# Patient Record
Sex: Male | Born: 1995 | Hispanic: Refuse to answer | Marital: Single | Smoking: Never smoker
Health system: Southern US, Community
[De-identification: ages and names within clinical notes are randomized; demographics above are authoritative.]

---

## 2017-06-14 ENCOUNTER — Ambulatory Visit (INDEPENDENT_AMBULATORY_CARE_PROVIDER_SITE_OTHER): Payer: BLUE CROSS/BLUE SHIELD | Admitting: Family Medicine

## 2017-06-14 ENCOUNTER — Encounter: Payer: Self-pay | Admitting: Family Medicine

## 2017-06-14 VITALS — BP 121/66 | HR 58 | Temp 97.7°F | Resp 14

## 2017-06-14 DIAGNOSIS — J069 Acute upper respiratory infection, unspecified: Secondary | ICD-10-CM

## 2017-06-14 MED ORDER — AZITHROMYCIN 250 MG PO TABS: ORAL_TABLET | ORAL | 0 refills | Status: AC

## 2017-06-14 MED ORDER — ALBUTEROL SULFATE HFA 108 (90 BASE) MCG/ACT IN AERS: 2.0000 | INHALATION_SPRAY | Freq: Four times a day (QID) | RESPIRATORY_TRACT | 0 refills | Status: AC | PRN

## 2017-06-14 NOTE — Progress Notes (Signed)
Patient presents today with symptoms of mild productive cough for about 2 weeks. Patient has had a intermittent sore throat as well. He denies any fever or chills recently but did have it early on. He denies any significant shortness of breath. He does mention that he has wheezing at times but is very intermittent. He denies any swelling or pain in his lower extremities. He denies any severe headache, nausea, vomiting, diarrhea, abdominal pain. He has not taken any medications for his symptoms. He denies any history of chronic allergies or asthma.  ROS: Negative except mentioned above. Vitals as per Epic. GENERAL: NAD HEENT: mild pharyngeal erythema, no exudate, no erythema of TMs, no cervical LAD RESP: CTA B, no wheezing or accessory muscle use appreciated, can speak in complete sentences without difficulty CARD: RRR NEURO: CN II-XII grossly intact   A/P: Upper respiratory infection - will treat with Z-Pak, Albuterol Inhaler when necessary, Delsym when necessary, rest, hydration, seek medical attention if symptoms persist or worsen as discussed. No athletic activity or class if febrile.

## 2017-07-28 ENCOUNTER — Other Ambulatory Visit: Payer: Self-pay | Admitting: Orthopedic Surgery

## 2017-07-28 DIAGNOSIS — M25532 Pain in left wrist: Secondary | ICD-10-CM

## 2017-07-29 ENCOUNTER — Ambulatory Visit
Admission: RE | Admit: 2017-07-29 | Discharge: 2017-07-29 | Disposition: A | Discharge status: Home / Self Care | Payer: BLUE CROSS/BLUE SHIELD | Source: Ambulatory Visit | Attending: Orthopedic Surgery | Admitting: Orthopedic Surgery

## 2017-07-29 DIAGNOSIS — S62145A Nondisplaced fracture of body of hamate [unciform] bone, left wrist, initial encounter for closed fracture: Secondary | ICD-10-CM | POA: Diagnosis not present

## 2017-07-29 DIAGNOSIS — M25532 Pain in left wrist: Secondary | ICD-10-CM | POA: Insufficient documentation

## 2017-07-29 DIAGNOSIS — X58XXXA Exposure to other specified factors, initial encounter: Secondary | ICD-10-CM | POA: Diagnosis not present

## 2018-06-17 IMAGING — MR MR WRIST*L* W/O CM
4 of 6 series · 28 of 40 positions shown · non-contrast
Comparison: None.

CLINICAL DATA: Ulnar-sided wrist pain x2 weeks. Prior hamate
surgery in in January 2017.

EXAM:
MR OF THE LEFT WRIST WITHOUT CONTRAST
TECHNIQUE: Multiplanar, multisequence MR imaging of the left wrist was
performed. No intravenous contrast was administered.

[Series 4: T2 fat-sat · sagittal · 3.0mm · 0.43mm/px · 6 of 25 slices shown]
[im 1/25]
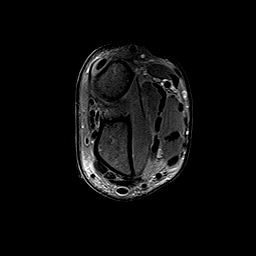
[im 5/25]
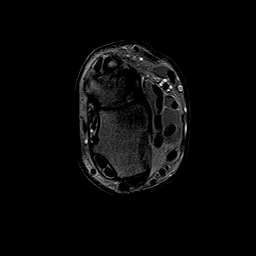
[im 10/25]
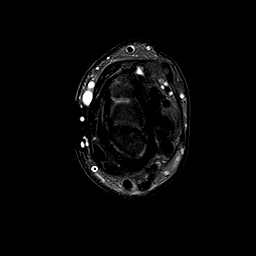
[im 15/25]
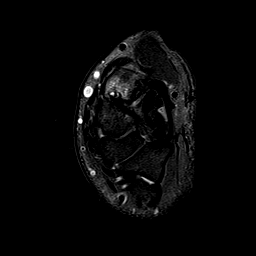
[im 20/25]
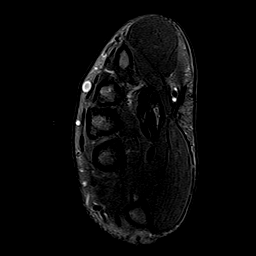
[im 25/25]
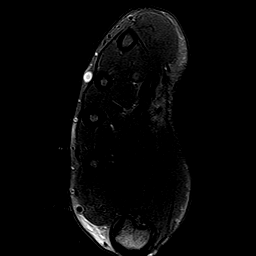

[Series 5: T1 · sagittal · 3.0mm · 0.43mm/px · 7 of 25 slices shown]
[im 1/25]
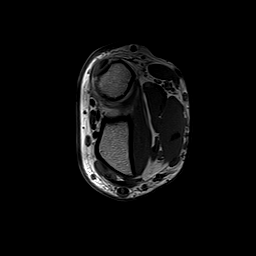
[im 5/25]
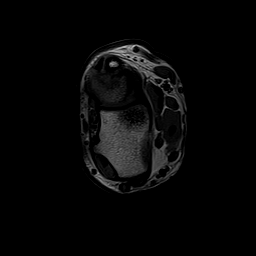
[im 9/25]
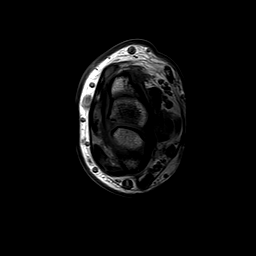
[im 13/25]
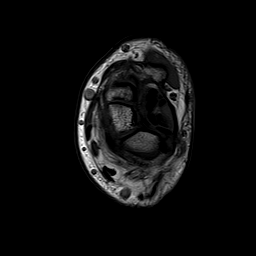
[im 17/25]
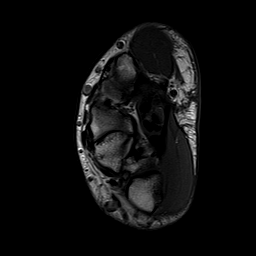
[im 21/25]
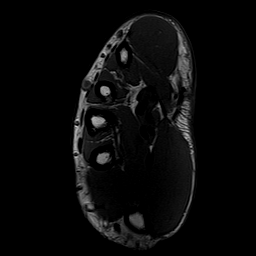
[im 25/25]
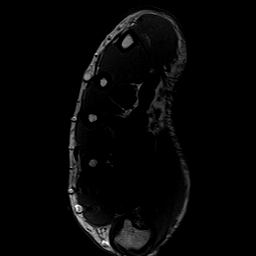

[Series 7: PD fat-sat · coronal · 3.0mm · 0.23mm/px · 6 of 21 slices shown (1 of 2)]
[im 1/21]
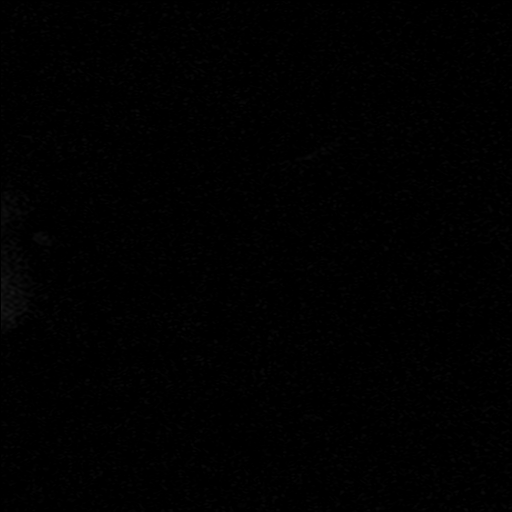
[im 5/21]
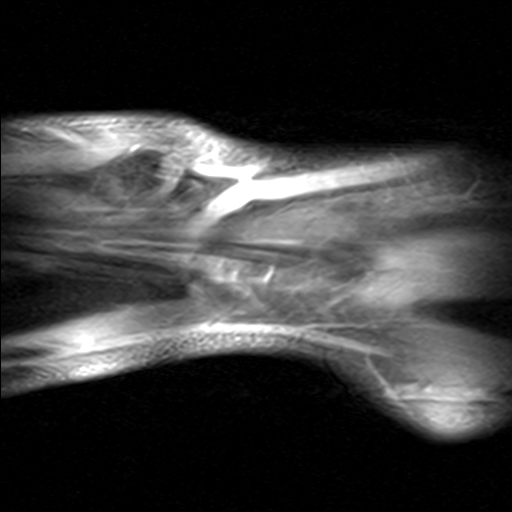
[im 9/21]
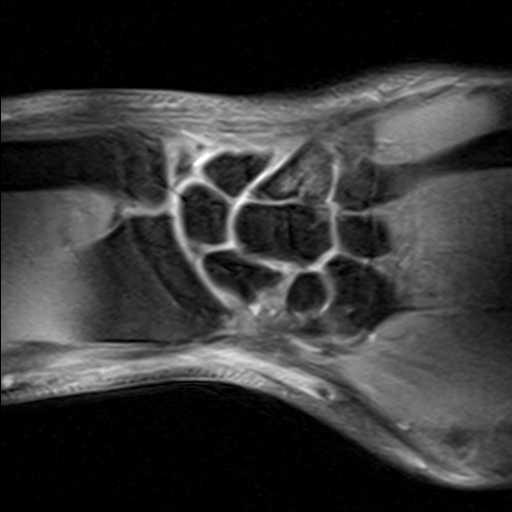
[im 13/21]
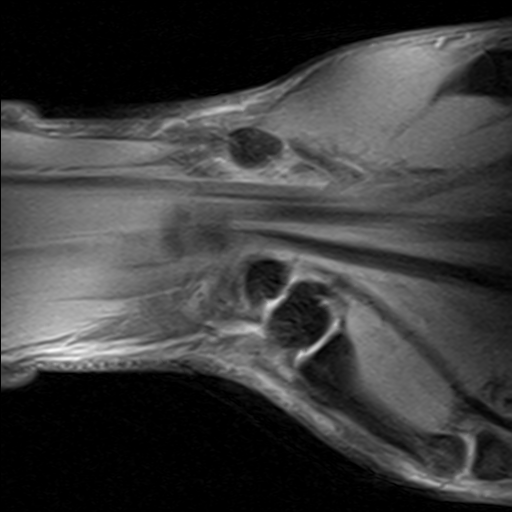
[im 17/21]
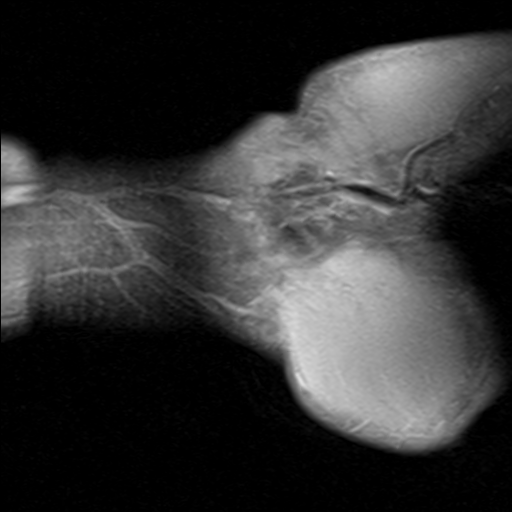
[im 21/21]
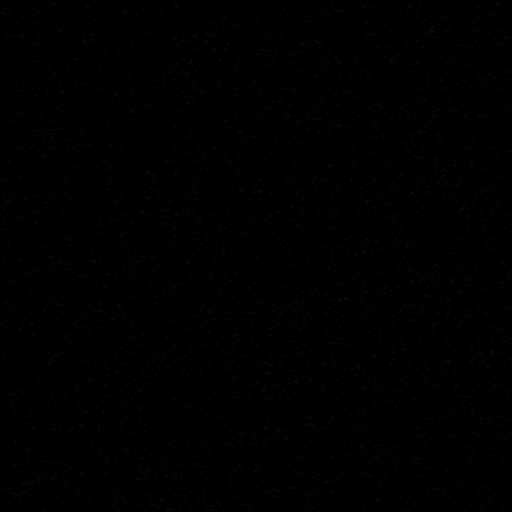

[Series 9: PD fat-sat · axial · 3.0mm · 0.21mm/px · z∈[-55,+34]mm · 9 of 33 slices shown (2 of 2)]
[im 1/33]
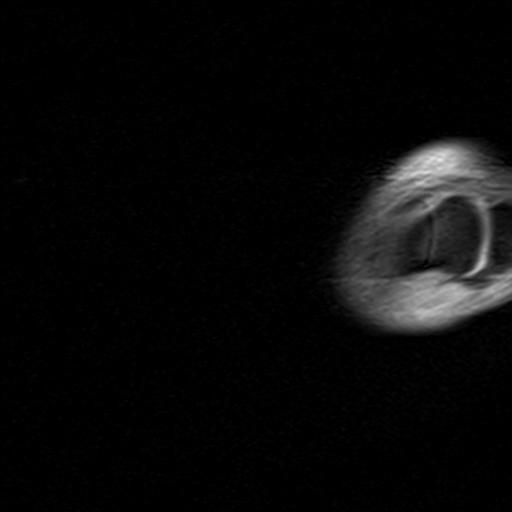
[im 5/33]
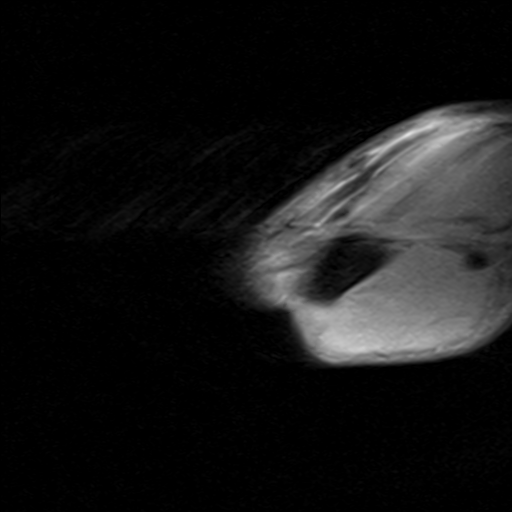
[im 9/33]
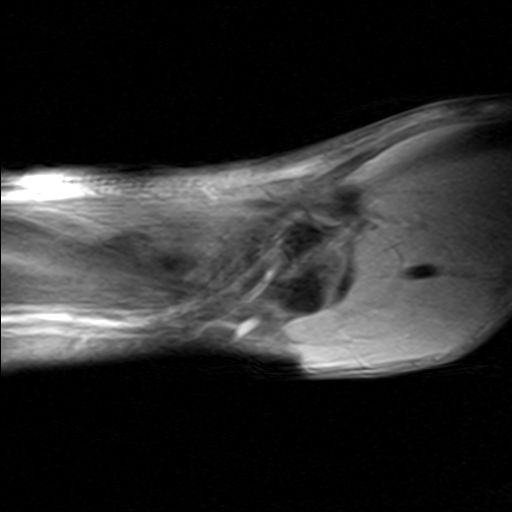
[im 13/33]
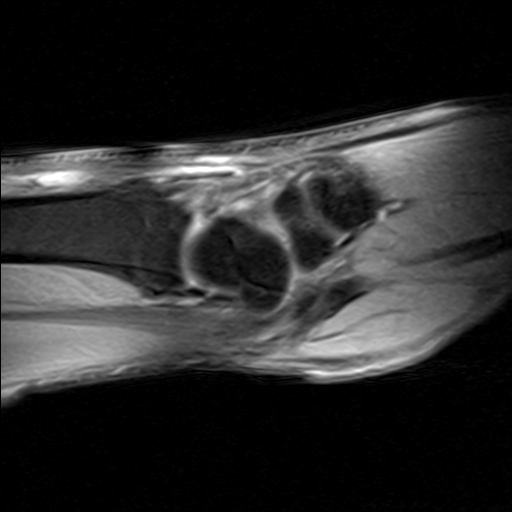
[im 17/33]
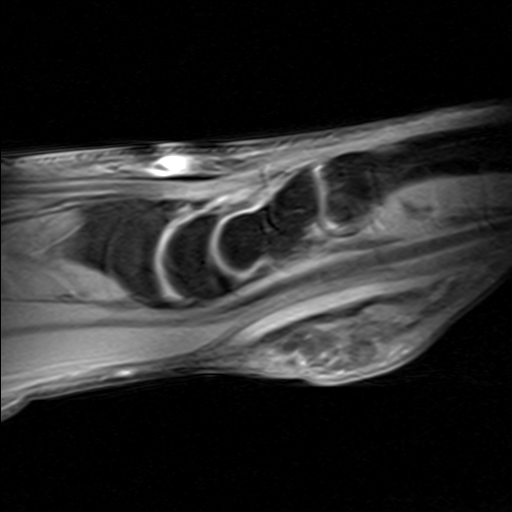
[im 21/33]
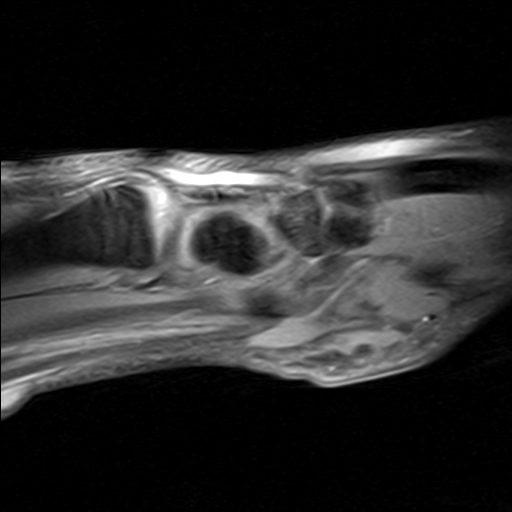
[im 25/33]
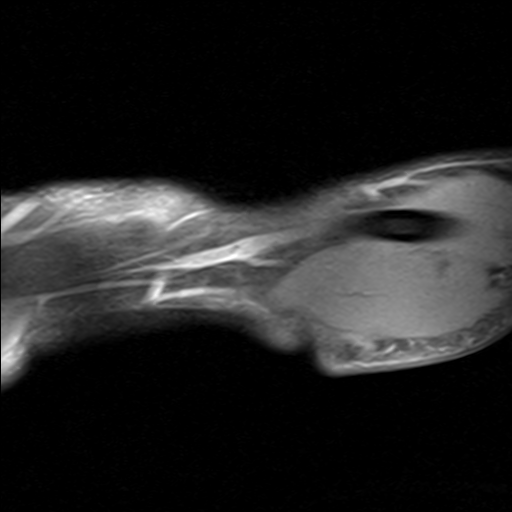
[im 29/33]
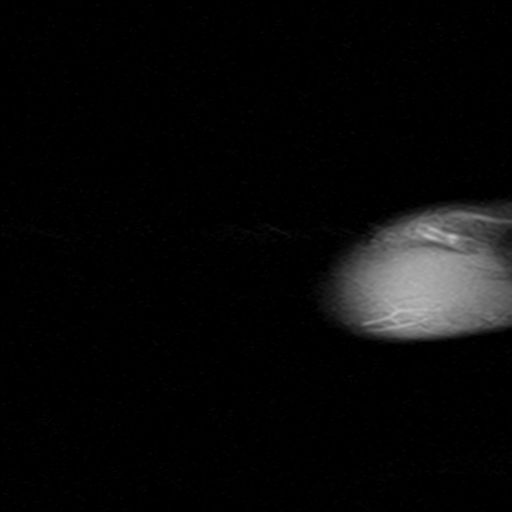
[im 33/33]
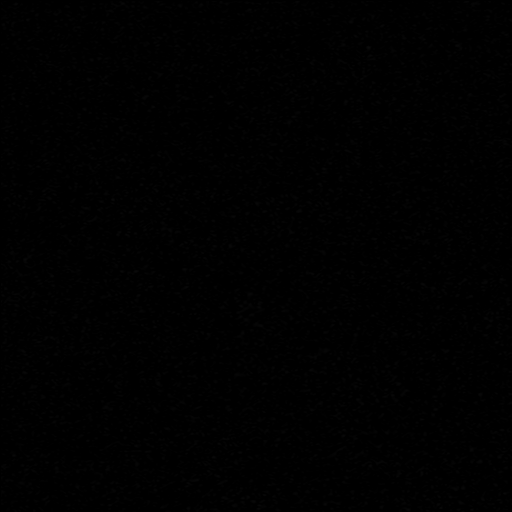

[28 of 40 positions shown; findings below may reference images not displayed]

FINDINGS: Ligaments: Intact scapholunate and lunotriquetral ligaments.

Triangular fibrocartilage: Small focus of hyperintense signal along
the thin radial portion of the TFCC. This in conjunction with what
appears to be a small amount of fluid in the distal radioulnar joint
is suspicious for a full-thickness perforation of the TFCC.

Tendons: No tendinopathy.

Carpal tunnel/median nerve: Normal carpal tunnel. Normal median
nerve.

Guyon's canal: Normal.

Joint/cartilage: Small distal radioulnar joint effusion.

Bones/carpal alignment: Marrow signal abnormality with linear
hypointense signal within the body of the hamate is noted.

Other: None
IMPRESSION: 1. Nondisplaced appearing fracture of the hamate with surrounding
marrow edema. The patient did have surgery in January 2017 however the
marrow edema should have resolved by now. An acute on chronic
injury/fracture might account for this marrow edema and transverse
hypointense fracture signal within. Chronic motion about a
pre-existing fracture may also account for this appearance.
2. Small focus of fluid in the distal radioulnar joint with faint
hyperintense signal along the thin membranous portion of the TFCC
may represent stigmata of a full-thickness perforation of the TFCC.
